# Patient Record
Sex: Male | Born: 1975 | Race: White | Hispanic: Yes | Marital: Single | Smoking: Never smoker
Health system: Southern US, Community
[De-identification: ages and names within clinical notes are randomized; demographics above are authoritative.]

---

## 2021-06-28 ENCOUNTER — Ambulatory Visit
Admission: RE | Admit: 2021-06-28 | Discharge: 2021-06-28 | Disposition: A | Discharge status: Home / Self Care | Payer: Self-pay | Source: Ambulatory Visit | Attending: Internal Medicine | Admitting: Internal Medicine

## 2021-06-28 ENCOUNTER — Ambulatory Visit (INDEPENDENT_AMBULATORY_CARE_PROVIDER_SITE_OTHER): Payer: Self-pay

## 2021-06-28 ENCOUNTER — Other Ambulatory Visit: Payer: Self-pay

## 2021-06-28 VITALS — BP 143/68 | HR 79 | Temp 98.1°F | Resp 14 | Ht 63.78 in | Wt 135.0 lb

## 2021-06-28 DIAGNOSIS — M25562 Pain in left knee: Secondary | ICD-10-CM

## 2021-06-28 MED ORDER — PREDNISONE 20 MG PO TABS: 40.0000 mg | ORAL_TABLET | Freq: Every day | ORAL | 0 refills | Status: AC

## 2021-06-28 NOTE — ED Triage Notes (Signed)
Patient c/o LFT knee pain and swelling x 8 days  Patient denies fall or trauma.   Patient endorses pain while sitting and/or walking, with no fluctuation in amount of pain based on movement.   Patient denies history of similar symptoms.   Patient has taken ibuprofen with no relief of symptoms.

## 2021-06-28 NOTE — Discharge Instructions (Addendum)
We are treating your left knee pain with prednisone to decrease swelling and inflammation.  Please follow-up if pain does not improve or if it worsens.  Please follow-up if any fever develops.  Continue to use ice application for 15 minutes at a time 2-3 times daily.

## 2021-06-28 NOTE — ED Provider Notes (Addendum)
[facility]    CSN: [id number] Arrival date & time: 06/28/21  0950      History   Chief Complaint Chief Complaint  Patient presents with   Knee Pain   1000 APPT    HPI Andrew Mckenzie is a 45 y.o. male.   Patient presents with left knee pain and swelling for approximately 8 days.  Patient denies any recent injury or past injury to left knee.  Pain is localized to left knee.  Pain is worse when lying flat but the pain also occurs with movement.  Patient is able to bear weight.  Denies history of arthritis.  Patient has taken over-the-counter ibuprofen with no relief of pain.  Has also used ice application.  Patient states that he works in [employer] and works while on his knees constantly. Denies any fever.    Knee Pain  History reviewed. No pertinent past medical history.  There are no problems to display for this patient.   History reviewed. No pertinent surgical history.     Home Medications    Prior to Admission medications   Medication Sig Start Date End Date Taking? Authorizing Provider  predniSONE (DELTASONE) 20 MG tablet Take 2 tablets (40 mg total) by mouth daily for 5 days. 06/28/21 07/03/21 Yes Lance Muss, FNP    Family History History reviewed. No pertinent family history.  Social History Social History   Tobacco Use   Smoking status: Never    Passive exposure: Never   Smokeless tobacco: Never  Substance Use Topics   Alcohol use: Yes    Comment: ocassionally   Drug use: Never     Allergies   Patient has no known allergies.   Review of Systems Review of Systems Per HPI  Physical Exam Triage Vital Signs ED Triage Vitals  Enc Vitals Group     BP 06/28/21 1024 (!) 143/68     Pulse Rate 06/28/21 1024 79     Resp 06/28/21 1024 14     Temp 06/28/21 1024 98.1 F (36.7 C)     Temp Source 06/28/21 1024 Oral     SpO2 06/28/21 1024 98 %     Weight 06/28/21 1020 135 lb (61.2 kg)     Height 06/28/21 1020 5' 3.78" (1.62 m)      Head Circumference --      Peak Flow --      Pain Score 06/28/21 1018 6     Pain Loc --      Pain Edu? --      Excl. in GC? --    No data found.  Updated Vital Signs BP (!) 143/68 (BP Location: Left Arm)   Pulse 79   Temp 98.1 F (36.7 C) (Oral)   Resp 14   Ht 5' 3.78" (1.62 m)   Wt 135 lb (61.2 kg)   SpO2 98%   BMI 23.33 kg/m   Visual Acuity Right Eye Distance:   Left Eye Distance:   Bilateral Distance:    Right Eye Near:   Left Eye Near:    Bilateral Near:     Physical Exam Constitutional:      Appearance: Normal appearance.  HENT:     Head: Normocephalic and atraumatic.  Eyes:     Extraocular Movements: Extraocular movements intact.     Conjunctiva/sclera: Conjunctivae normal.  Pulmonary:     Effort: Pulmonary effort is normal.  Musculoskeletal:     Right knee: Normal.     Left knee: Swelling present.  Normal range of motion. Tenderness present. Normal pulse.     Comments: Tenderness to palpation to upper portion of left patella.  Neurological:     General: No focal deficit present.     Mental Status: He is alert and oriented to person, place, and time. Mental status is at baseline.  Psychiatric:        Mood and Affect: Mood normal.        Behavior: Behavior normal.        Thought Content: Thought content normal.        Judgment: Judgment normal.     UC Treatments / Results  Labs (all labs ordered are listed, but only abnormal results are displayed) Labs Reviewed - No data to display  EKG   Radiology DG Knee Complete 4 Views Left  Result Date: 06/28/2021 CLINICAL DATA:  Left knee pain and swelling EXAM: LEFT KNEE - COMPLETE 4+ VIEW COMPARISON:  None. FINDINGS: Alignment is anatomic. No acute fracture. Joint spaces are preserved. No joint effusion. IMPRESSION: Negative. Electronically Signed   By: Guadlupe Spanish M.D.   On: 06/28/2021 11:19    Procedures Procedures (including critical care time)  Medications Ordered in UC Medications - No data to  display  Initial Impression / Assessment and Plan / UC Course  I have reviewed the triage vital signs and the nursing notes.  Pertinent labs & imaging results that were available during my care of the patient were reviewed by me and considered in my medical decision making (see chart for details).     Left knee x-ray negative for fracture, dislocation, any bony abnormality.  Will treat pain and swelling of left knee with prednisone x5 days. Patient to continue ice application.  Patient advised to avoid any ibuprofen, Advil, Aleve while taking prednisone.  May take Tylenol as needed.  Patient to follow-up if knee pain does not improve, worsens, or fever develops. Discussed strict return precautions. Patient verbalized understanding and is agreeable with plan.   Interpreter used throughout patient visit.  Final Clinical Impressions(s) / UC Diagnoses   Final diagnoses:  Acute pain of left knee     Discharge Instructions      We are treating your left knee pain with prednisone to decrease swelling and inflammation.  Please follow-up if pain does not improve or if it worsens.  Please follow-up if any fever develops.  Continue to use ice application for 15 minutes at a time 2-3 times daily.      ED Prescriptions     Medication Sig Dispense Auth. Provider   predniSONE (DELTASONE) 20 MG tablet Take 2 tablets (40 mg total) by mouth daily for 5 days. 10 tablet Lance Muss, FNP      PDMP not reviewed this encounter.   Lance Muss, FNP 06/28/21 1140    Lance Muss, [place] 06/28/21 [phone]

## 2022-07-18 IMAGING — DX DG KNEE COMPLETE 4+V*L*
4 series · 4 of 4 positions shown · non-contrast
Comparison: None.

CLINICAL DATA: Left knee pain and swelling

EXAM:
LEFT KNEE - COMPLETE 4+ VIEW

[knee ap]
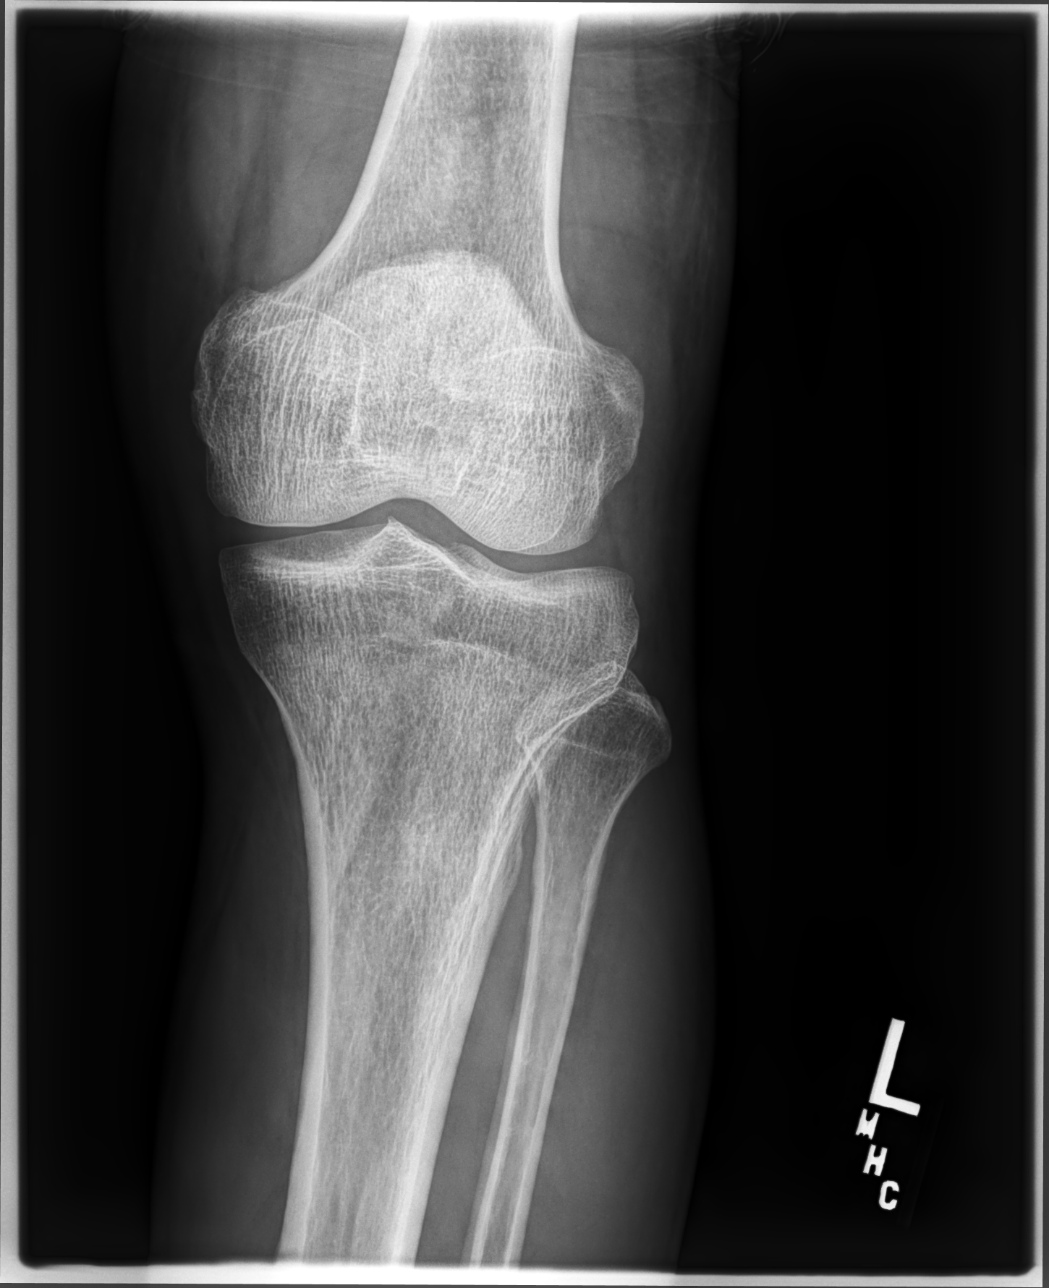

[knee lat]
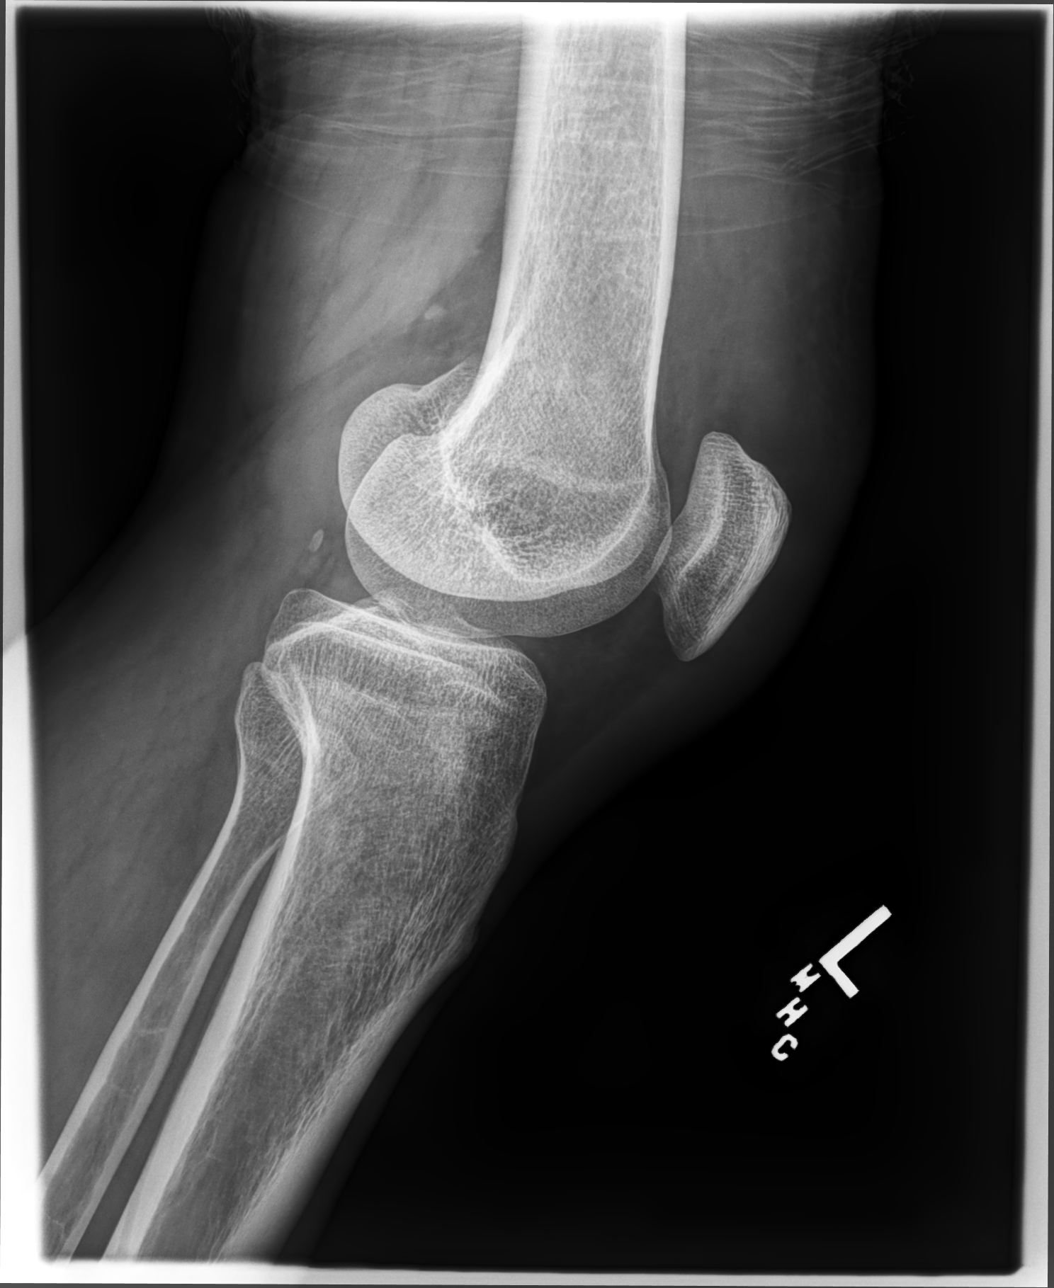

[knee lmo]
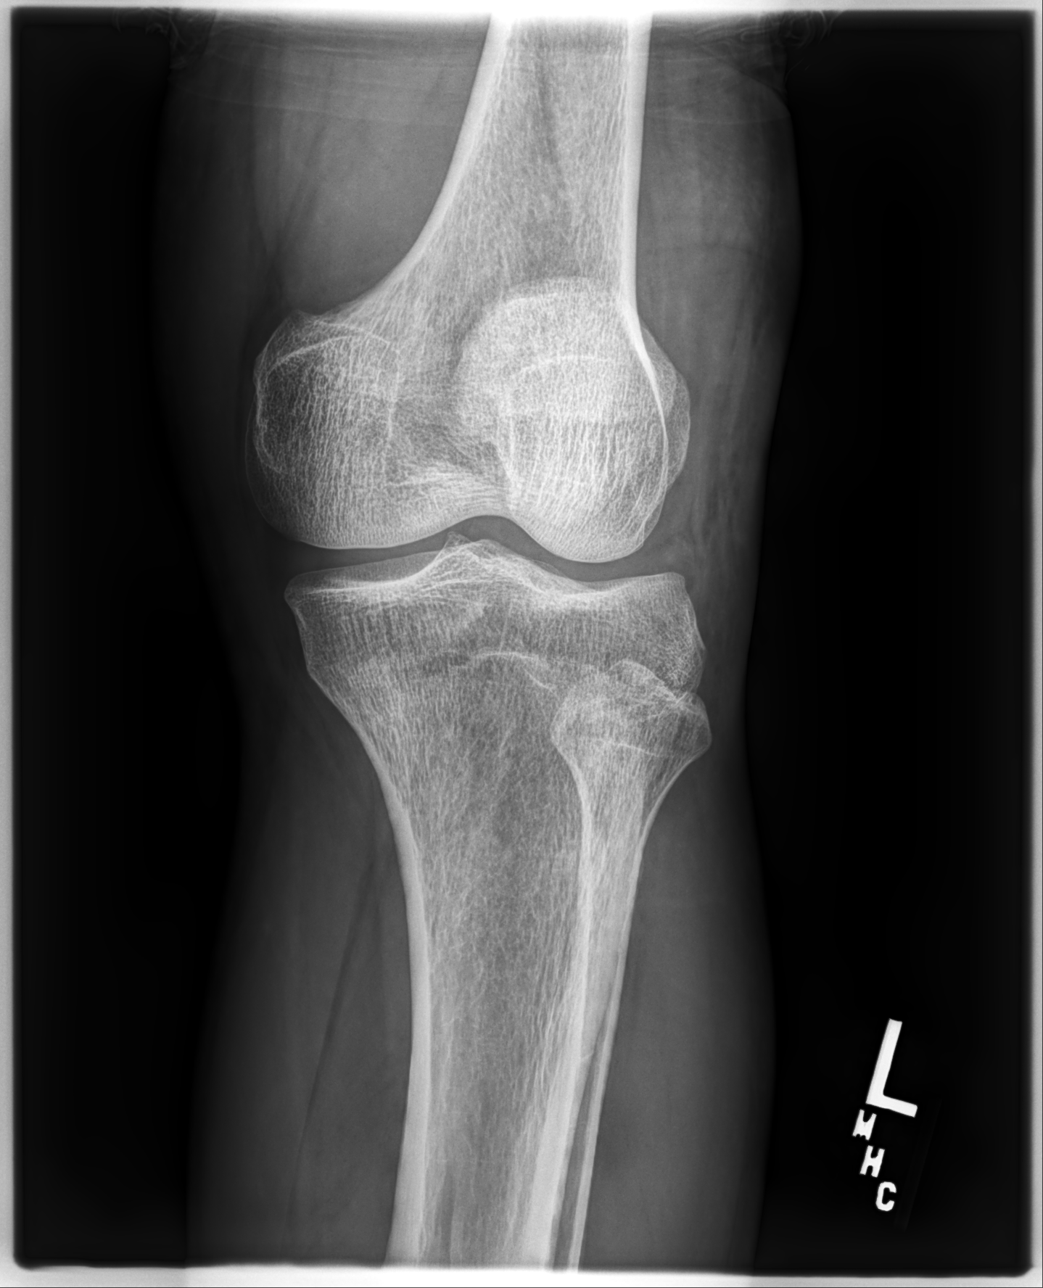

[knee mlo]
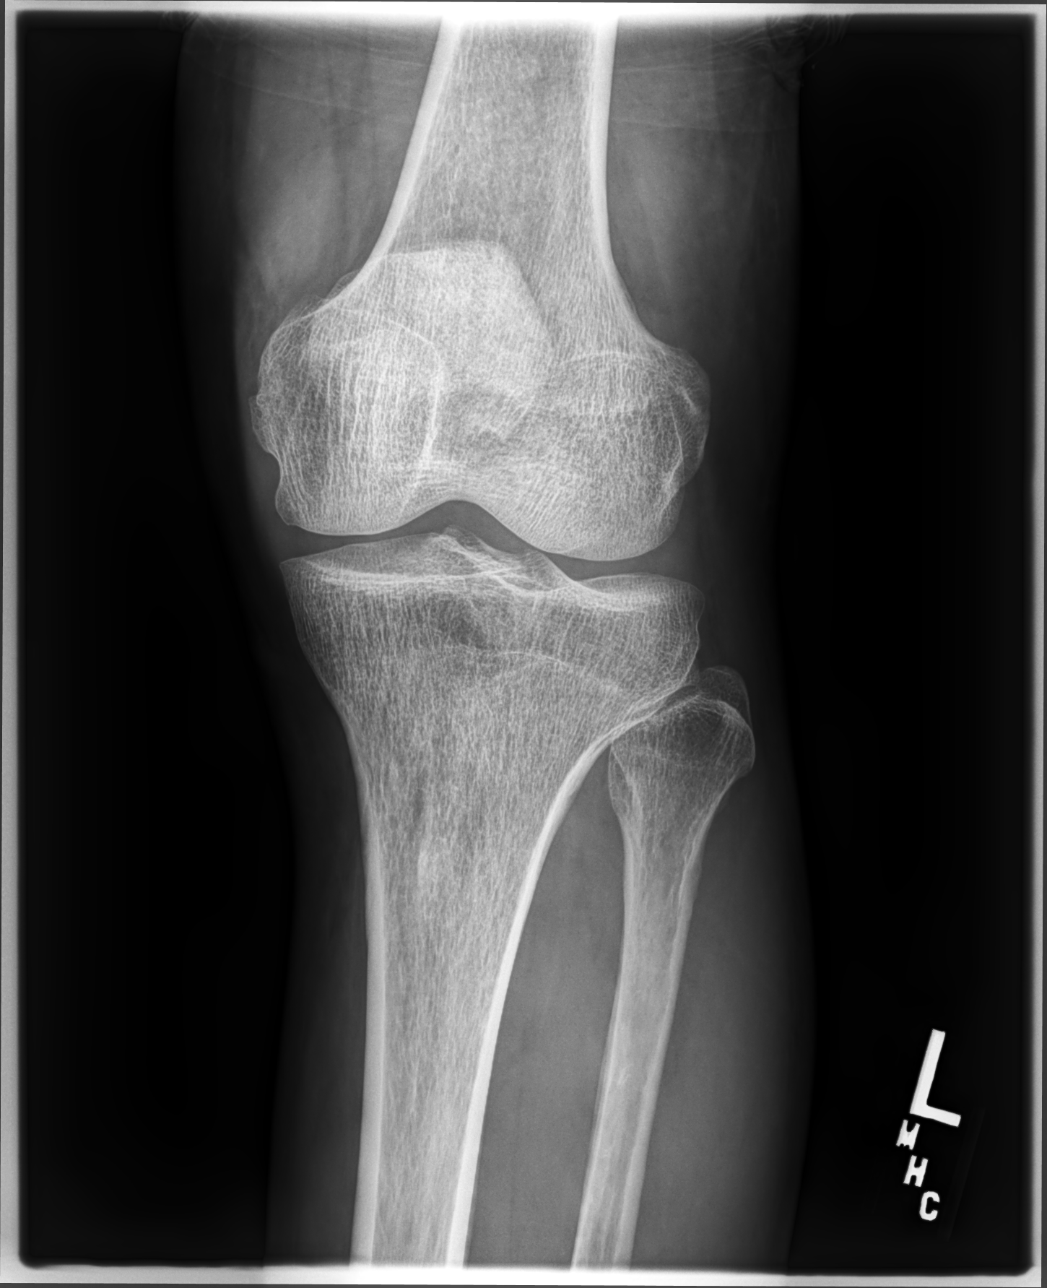

[4 of 4 positions shown; findings below may reference images not displayed]

FINDINGS: Alignment is anatomic. No acute fracture. Joint spaces are
preserved. No joint effusion.
IMPRESSION: Negative.
